# Patient Record
Sex: Female | Born: 2005 | Race: White | Hispanic: No | Marital: Single | Smoking: Current every day smoker
Health system: Southern US, Community
[De-identification: ages and names within clinical notes are randomized; demographics above are authoritative.]

## PROBLEM LIST (undated history)

## (undated) DIAGNOSIS — T50901A Poisoning by unspecified drugs, medicaments and biological substances, accidental (unintentional), initial encounter: Secondary | ICD-10-CM

## (undated) DIAGNOSIS — F419 Anxiety disorder, unspecified: Secondary | ICD-10-CM

## (undated) DIAGNOSIS — F319 Bipolar disorder, unspecified: Secondary | ICD-10-CM

---
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
## 2023-01-11 ENCOUNTER — Ambulatory Visit (HOSPITAL_COMMUNITY)
Admission: EM | Admit: 2023-01-11 | Discharge: 2023-01-11 | Disposition: A | Discharge status: Home / Self Care | Payer: Medicaid Other | Attending: Physician Assistant | Admitting: Physician Assistant

## 2023-01-11 ENCOUNTER — Other Ambulatory Visit: Payer: Self-pay

## 2023-01-11 ENCOUNTER — Encounter (HOSPITAL_COMMUNITY): Payer: Self-pay | Admitting: *Deleted

## 2023-01-11 DIAGNOSIS — J101 Influenza due to other identified influenza virus with other respiratory manifestations: Secondary | ICD-10-CM | POA: Diagnosis present

## 2023-01-11 DIAGNOSIS — Z1152 Encounter for screening for COVID-19: Secondary | ICD-10-CM | POA: Insufficient documentation

## 2023-01-11 LAB — POC INFLUENZA A AND B ANTIGEN (URGENT CARE ONLY)
INFLUENZA A ANTIGEN, POC: NEGATIVE
INFLUENZA B ANTIGEN, POC: POSITIVE — AB

## 2023-01-11 MED ORDER — ONDANSETRON 4 MG PO TBDP: 4.0000 mg | ORAL_TABLET | Freq: Three times a day (TID) | ORAL | 0 refills | Status: DC | PRN

## 2023-01-11 NOTE — ED Provider Notes (Signed)
[place]    CSN: [id number] Arrival date & time: 01/11/23  1135      History   Chief Complaint Chief Complaint  Patient presents with   Sore Throat   Cough   Emesis   Generalized Body Aches    HPI Brandi Martinez is a 17 y.o. female.   Patient here concerned with fever x 3 days.  Tmax 103.4 last night.  Admits fatigue, nasal congestion, rhinorrhea, cough, n/v.  She states her cousin has flu.  She is taking OTC cold/flu medication and dramamine for nausea.  She states dramamine is helping.    Past Medical History:  Diagnosis Date   Anxiety    Bipolar 1 disorder [facility])    Overdose     Patient Active Problem List   Diagnosis Date Noted   Bipolar disorder current episode depressed ([place]) 11/11/2022   Rhabdomyolysis 08/10/2022   Transaminitis 08/10/2022   Hypoglycemia 08/10/2022   Diphenhydramine overdose, intentional self-harm, initial encounter ([place]) 08/09/2022   Leukocytosis 08/09/2022    History reviewed. No pertinent surgical history.  OB History   No obstetric history on file.      Home Medications    Prior to Admission medications   Medication Sig Start Date End Date Taking? Authorizing Provider  ondansetron (ZOFRAN-ODT) 4 MG disintegrating tablet Take 1 tablet (4 mg total) by mouth every 8 (eight) hours as needed for nausea or vomiting. 01/11/23  Yes Peri Jefferson, PA-C  ARIPiprazole (ABILIFY) 5 MG tablet Take 1 tablet (5 mg total) by mouth daily. 11/14/22   Alesia Morin, MD  hydrOXYzine (VISTARIL) 25 MG capsule Take 25 mg by mouth every 6 (six) hours as needed for anxiety. 08/19/22   [provider]  nicotine polacrilex (NICORETTE) 2 MG gum Take 1 each (2 mg total) by mouth as needed for smoking cessation. 11/14/22   Alesia Morin, MD  SRONYX 0.1-20 MG-MCG tablet Take 1 tablet by mouth daily. 10/18/22   [provider]    Family History History reviewed. No pertinent family history.  Social History Social History    Tobacco Use   Smoking status: Every Day    Types: Cigarettes   Smokeless tobacco: Former  Scientific laboratory technician Use: Never used  Substance Use Topics   Alcohol use: Yes   Drug use: Yes    Types: Marijuana     Allergies   Patient has no known allergies.   Review of Systems Review of Systems  Constitutional:  Positive for appetite change, chills, fatigue and fever.  HENT:  Positive for congestion, postnasal drip and rhinorrhea. Negative for ear pain, nosebleeds, sinus pressure, sinus pain and sore throat.   Eyes:  Negative for pain and redness.  Respiratory:  Positive for cough. Negative for shortness of breath and wheezing.   Gastrointestinal:  Positive for nausea and vomiting. Negative for abdominal pain and diarrhea.  Musculoskeletal:  Positive for myalgias. Negative for arthralgias.  Skin:  Negative for rash.  Neurological:  Negative for light-headedness and headaches.  Hematological:  Negative for adenopathy. Does not bruise/bleed easily.  Psychiatric/Behavioral:  Positive for sleep disturbance. Negative for confusion.      Physical Exam Triage Vital Signs ED Triage Vitals  Enc Vitals Group     BP 01/11/23 1159 102/69     Pulse Rate 01/11/23 1159 (!) 115     Resp 01/11/23 1159 18     Temp 01/11/23 1159 99.8 F (37.7 C)     Temp src --  SpO2 01/11/23 1159 96 %     Weight --      Height --      Head Circumference --      Peak Flow --      Pain Score 01/11/23 1156 9     Pain Loc --      Pain Edu? --      Excl. in [place]? --    No data found.  Updated Vital Signs BP 102/69   Pulse (!) 115   Temp 99.8 F (37.7 C)   Resp 18   LMP 01/11/2023   SpO2 96%   Visual Acuity Right Eye Distance:   Left Eye Distance:   Bilateral Distance:    Right Eye Near:   Left Eye Near:    Bilateral Near:     Physical Exam Vitals and nursing note reviewed.  Constitutional:      General: She is not in acute distress.    Appearance: Normal appearance. She is not  ill-appearing.  HENT:     Head: Normocephalic and atraumatic.     Right Ear: Tympanic membrane and ear canal normal.     Left Ear: Tympanic membrane and ear canal normal.     Nose: Congestion and rhinorrhea present. No mucosal edema. Rhinorrhea is clear.     Comments: Injected nasal passages    Mouth/Throat:     Pharynx: Oropharynx is clear. Uvula midline. No pharyngeal swelling, oropharyngeal exudate or posterior oropharyngeal erythema.     Tonsils: No tonsillar exudate.  Eyes:     General: No scleral icterus.    Extraocular Movements: Extraocular movements intact.     Conjunctiva/sclera: Conjunctivae normal.  Cardiovascular:     Rate and Rhythm: Regular rhythm. Tachycardia present.     Heart sounds: No murmur heard. Pulmonary:     Effort: Pulmonary effort is normal. No respiratory distress.     Breath sounds: Normal breath sounds. No wheezing or rales.  Musculoskeletal:     Cervical back: Normal range of motion. No rigidity.  Lymphadenopathy:     Cervical: No cervical adenopathy.  Skin:    Coloration: Skin is not jaundiced.     Findings: No rash.  Neurological:     General: No focal deficit present.     Mental Status: She is alert and oriented to person, place, and time.     Motor: No weakness.     Gait: Gait normal.  Psychiatric:        Mood and Affect: Mood normal.        Behavior: Behavior normal.      UC Treatments / Results  Labs (all labs ordered are listed, but only abnormal results are displayed) Labs Reviewed  POC INFLUENZA A AND B ANTIGEN (URGENT CARE ONLY) - Abnormal; Notable for the following components:      Result Value   INFLUENZA B ANTIGEN, POC POSITIVE (*)    All other components within normal limits  SARS CORONAVIRUS 2 (TAT 6-24 HRS)    EKG   Radiology No results found.  Procedures Procedures (including critical care time)  Medications Ordered in UC Medications - No data to display  Initial Impression / Assessment and Plan / UC Course   I have reviewed the triage vital signs and the nursing notes.  Pertinent labs & imaging results that were available during my care of the patient were reviewed by me and considered in my medical decision making (see chart for details).     Outside treatment window for  Tamiflu Drink plenty of water Get plenty of rest  Final Clinical Impressions(s) / UC Diagnoses   Final diagnoses:  Influenza B     Discharge Instructions      Take ibuprofen as needed     ED Prescriptions     Medication Sig Dispense Auth. Provider   ondansetron (ZOFRAN-ODT) 4 MG disintegrating tablet Take 1 tablet (4 mg total) by mouth every 8 (eight) hours as needed for nausea or vomiting. 8 tablet Peri Jefferson, PA-C      PDMP not reviewed this encounter.   Peri Jefferson, PA-C 01/11/23 1310

## 2023-01-11 NOTE — ED Triage Notes (Signed)
Pt reports since Thursday she has had Vomiting ,sore throat ,cough and general body aches.

## 2023-01-11 NOTE — Discharge Instructions (Signed)
Take ibuprofen as needed. 

## 2023-01-12 LAB — SARS CORONAVIRUS 2 (TAT 6-24 HRS): SARS Coronavirus 2: NEGATIVE

## 2024-04-03 ENCOUNTER — Ambulatory Visit (HOSPITAL_COMMUNITY)
Admission: EM | Admit: 2024-04-03 | Discharge: 2024-04-04 | Disposition: A | Discharge status: Other Acute Inpatient Hospital | Payer: MEDICAID | Attending: Urology | Admitting: Urology

## 2024-04-03 DIAGNOSIS — F319 Bipolar disorder, unspecified: Secondary | ICD-10-CM | POA: Insufficient documentation

## 2024-04-03 DIAGNOSIS — F322 Major depressive disorder, single episode, severe without psychotic features: Secondary | ICD-10-CM | POA: Insufficient documentation

## 2024-04-03 DIAGNOSIS — Z9151 Personal history of suicidal behavior: Secondary | ICD-10-CM | POA: Insufficient documentation
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
## 2024-04-04 ENCOUNTER — Encounter (HOSPITAL_COMMUNITY): Payer: Self-pay | Admitting: Psychiatry

## 2024-04-04 ENCOUNTER — Inpatient Hospital Stay (HOSPITAL_COMMUNITY)
Admission: AD | Admit: 2024-04-04 | Discharge: 2024-04-07 | DRG: 885 | Disposition: A | Discharge status: Home / Self Care | Payer: MEDICAID | Source: Intra-hospital | Attending: Psychiatry | Admitting: Psychiatry

## 2024-04-04 ENCOUNTER — Other Ambulatory Visit: Payer: Self-pay

## 2024-04-04 DIAGNOSIS — F1290 Cannabis use, unspecified, uncomplicated: Secondary | ICD-10-CM | POA: Diagnosis present

## 2024-04-04 DIAGNOSIS — R45851 Suicidal ideations: Secondary | ICD-10-CM | POA: Diagnosis present

## 2024-04-04 DIAGNOSIS — F17290 Nicotine dependence, other tobacco product, uncomplicated: Secondary | ICD-10-CM | POA: Diagnosis present

## 2024-04-04 DIAGNOSIS — Z560 Unemployment, unspecified: Secondary | ICD-10-CM

## 2024-04-04 DIAGNOSIS — F319 Bipolar disorder, unspecified: Secondary | ICD-10-CM | POA: Diagnosis not present

## 2024-04-04 DIAGNOSIS — F17210 Nicotine dependence, cigarettes, uncomplicated: Secondary | ICD-10-CM | POA: Diagnosis present

## 2024-04-04 DIAGNOSIS — Z91148 Patient's other noncompliance with medication regimen for other reason: Secondary | ICD-10-CM

## 2024-04-04 DIAGNOSIS — Z630 Problems in relationship with spouse or partner: Secondary | ICD-10-CM | POA: Diagnosis not present

## 2024-04-04 DIAGNOSIS — F322 Major depressive disorder, single episode, severe without psychotic features: Secondary | ICD-10-CM | POA: Insufficient documentation

## 2024-04-04 DIAGNOSIS — F3130 Bipolar disorder, current episode depressed, mild or moderate severity, unspecified: Principal | ICD-10-CM | POA: Diagnosis present

## 2024-04-04 DIAGNOSIS — F314 Bipolar disorder, current episode depressed, severe, without psychotic features: Secondary | ICD-10-CM | POA: Diagnosis not present

## 2024-04-04 DIAGNOSIS — Z91199 Patient's noncompliance with other medical treatment and regimen due to unspecified reason: Secondary | ICD-10-CM | POA: Diagnosis not present

## 2024-04-04 DIAGNOSIS — Z9151 Personal history of suicidal behavior: Secondary | ICD-10-CM

## 2024-04-04 LAB — HEMOGLOBIN A1C
Hgb A1c MFr Bld: 4.9 % (ref 4.8–5.6)
Mean Plasma Glucose: 93.93 mg/dL

## 2024-04-04 LAB — CBC WITH DIFFERENTIAL/PLATELET
Abs Immature Granulocytes: 0.07 10*3/uL (ref 0.00–0.07)
Abs Immature Granulocytes: 0.03 10*3/uL (ref 0.00–0.07)
Basophils Absolute: 0.1 10*3/uL (ref 0.0–0.1)
Basophils Absolute: 0 10*3/uL (ref 0.0–0.1)
Basophils Relative: 0 %
Basophils Relative: 0 %
Eosinophils Absolute: 0 10*3/uL (ref 0.0–0.5)
Eosinophils Absolute: 0.1 10*3/uL (ref 0.0–0.5)
Eosinophils Relative: 0 %
Eosinophils Relative: 0 %
HCT: 42 % (ref 36.0–46.0)
HCT: 40.4 % (ref 36.0–46.0)
Hemoglobin: 13.7 g/dL (ref 12.0–15.0)
Hemoglobin: 13.3 g/dL (ref 12.0–15.0)
Immature Granulocytes: 0 %
Immature Granulocytes: 0 %
Lymphocytes Relative: 11 %
Lymphocytes Relative: 22 %
Lymphs Abs: 2.3 10*3/uL (ref 0.7–4.0)
Lymphs Abs: 2.6 10*3/uL (ref 0.7–4.0)
MCH: 29.2 pg (ref 26.0–34.0)
MCH: 29 pg (ref 26.0–34.0)
MCHC: 32.6 g/dL (ref 30.0–36.0)
MCHC: 32.9 g/dL (ref 30.0–36.0)
MCV: 89.6 fL (ref 80.0–100.0)
MCV: 88 fL (ref 80.0–100.0)
Monocytes Absolute: 0.9 10*3/uL (ref 0.1–1.0)
Monocytes Absolute: 0.7 10*3/uL (ref 0.1–1.0)
Monocytes Relative: 5 %
Monocytes Relative: 6 %
Neutro Abs: 16.4 10*3/uL — ABNORMAL HIGH (ref 1.7–7.7)
Neutro Abs: 8.1 10*3/uL — ABNORMAL HIGH (ref 1.7–7.7)
Neutrophils Relative %: 84 %
Neutrophils Relative %: 72 %
Platelets: 228 10*3/uL (ref 150–400)
Platelets: 218 10*3/uL (ref 150–400)
RBC: 4.69 MIL/uL (ref 3.87–5.11)
RBC: 4.59 MIL/uL (ref 3.87–5.11)
RDW: 12.9 % (ref 11.5–15.5)
RDW: 13 % (ref 11.5–15.5)
WBC: 19.7 10*3/uL — ABNORMAL HIGH (ref 4.0–10.5)
WBC: 11.5 10*3/uL — ABNORMAL HIGH (ref 4.0–10.5)
nRBC: 0 % (ref 0.0–0.2)
nRBC: 0 % (ref 0.0–0.2)

## 2024-04-04 LAB — COMPREHENSIVE METABOLIC PANEL WITH GFR
ALT: 13 U/L (ref 0–44)
AST: 20 U/L (ref 15–41)
Albumin: 5.1 g/dL — ABNORMAL HIGH (ref 3.5–5.0)
Alkaline Phosphatase: 69 U/L (ref 38–126)
Anion gap: 14 (ref 5–15)
BUN: 13 mg/dL (ref 6–20)
CO2: 22 mmol/L (ref 22–32)
Calcium: 10 mg/dL (ref 8.9–10.3)
Chloride: 103 mmol/L (ref 98–111)
Creatinine, Ser: 0.84 mg/dL (ref 0.44–1.00)
GFR, Estimated: >60 mL/min (ref >60)
Glucose, Bld: 86 mg/dL (ref 70–99)
Potassium: 3.7 mmol/L (ref 3.5–5.1)
Sodium: 139 mmol/L (ref 135–145)
Total Bilirubin: 0.7 mg/dL (ref 0.0–1.2)
Total Protein: 8.2 g/dL — ABNORMAL HIGH (ref 6.5–8.1)

## 2024-04-04 LAB — POCT URINE DRUG SCREEN - MANUAL ENTRY (I-SCREEN)
POC Amphetamine UR: NOT DETECTED
POC Buprenorphine (BUP): NOT DETECTED
POC Cocaine UR: NOT DETECTED
POC Marijuana UR: POSITIVE — AB
POC Methadone UR: NOT DETECTED
POC Methamphetamine UR: NOT DETECTED
POC Morphine: NOT DETECTED
POC Oxazepam (BZO): NOT DETECTED
POC Oxycodone UR: NOT DETECTED
POC Secobarbital (BAR): NOT DETECTED

## 2024-04-04 LAB — LIPID PANEL
Cholesterol: 179 mg/dL (ref 0–200)
HDL: 60 mg/dL (ref >40)
LDL Cholesterol: 110 mg/dL — ABNORMAL HIGH (ref 0–99)
Total CHOL/HDL Ratio: 3 ratio
Triglycerides: 46 mg/dL (ref <150)
VLDL: 9 mg/dL (ref 0–40)

## 2024-04-04 LAB — POC URINE PREG, ED: Preg Test, Ur: NEGATIVE

## 2024-04-04 LAB — ETHANOL: Alcohol, Ethyl (B): <15 mg/dL (ref <15)

## 2024-04-04 LAB — TSH: TSH: 2.729 u[IU]/mL (ref 0.350–4.500)

## 2024-04-04 MED ORDER — HALOPERIDOL LACTATE 5 MG/ML IJ SOLN: 5.0000 mg | Freq: Three times a day (TID) | INTRAMUSCULAR | Status: DC | PRN

## 2024-04-04 MED ORDER — DIPHENHYDRAMINE HCL 50 MG/ML IJ SOLN: 50.0000 mg | Freq: Three times a day (TID) | INTRAMUSCULAR | Status: DC | PRN

## 2024-04-04 MED ORDER — ACETAMINOPHEN 325 MG PO TABS: 650.0000 mg | ORAL_TABLET | Freq: Four times a day (QID) | ORAL | Status: DC | PRN

## 2024-04-04 MED ORDER — MAGNESIUM HYDROXIDE 400 MG/5ML PO SUSP: 30.0000 mL | Freq: Every day | ORAL | Status: DC | PRN

## 2024-04-04 MED ORDER — ALUM & MAG HYDROXIDE-SIMETH 200-200-20 MG/5ML PO SUSP: 30.0000 mL | ORAL | Status: DC | PRN

## 2024-04-04 MED ORDER — HYDROXYZINE HCL 25 MG PO TABS
25.0000 mg | ORAL_TABLET | Freq: Three times a day (TID) | ORAL | Status: DC | PRN
  Administered 2024-04-04: 25 mg via ORAL
  Filled 2024-04-04: qty 1

## 2024-04-04 MED ORDER — DIPHENHYDRAMINE HCL 25 MG PO CAPS: 50.0000 mg | ORAL_CAPSULE | Freq: Three times a day (TID) | ORAL | Status: DC | PRN

## 2024-04-04 MED ORDER — HALOPERIDOL 5 MG PO TABS: 5.0000 mg | ORAL_TABLET | Freq: Three times a day (TID) | ORAL | Status: DC | PRN

## 2024-04-04 MED ORDER — LORAZEPAM 2 MG/ML IJ SOLN: 2.0000 mg | Freq: Three times a day (TID) | INTRAMUSCULAR | Status: DC | PRN

## 2024-04-04 MED ORDER — HALOPERIDOL LACTATE 5 MG/ML IJ SOLN: 10.0000 mg | Freq: Three times a day (TID) | INTRAMUSCULAR | Status: DC | PRN

## 2024-04-04 MED ORDER — HYDROXYZINE HCL 25 MG PO TABS
25.0000 mg | ORAL_TABLET | Freq: Three times a day (TID) | ORAL | Status: DC | PRN
  Administered 2024-04-04 – 2024-04-06 (×3): 25 mg via ORAL
  Filled 2024-04-04 (×3): qty 1

## 2024-04-04 MED ORDER — TRAZODONE HCL 50 MG PO TABS
50.0000 mg | ORAL_TABLET | Freq: Every evening | ORAL | Status: DC | PRN
  Administered 2024-04-04: 50 mg via ORAL
  Filled 2024-04-04: qty 1

## 2024-04-04 MED ORDER — DIPHENHYDRAMINE HCL 50 MG PO CAPS: 50.0000 mg | ORAL_CAPSULE | Freq: Three times a day (TID) | ORAL | Status: DC | PRN

## 2024-04-04 MED ORDER — TRAZODONE HCL 50 MG PO TABS
50.0000 mg | ORAL_TABLET | Freq: Every evening | ORAL | Status: DC | PRN
  Administered 2024-04-04 – 2024-04-06 (×3): 50 mg via ORAL
  Filled 2024-04-04 (×3): qty 1

## 2024-04-04 NOTE — Progress Notes (Signed)
 Patient admitted to unit, reviewed unit schedule and rules,patient verbalized understanding. Assessment completed per flowsheet, 15 mins safety checks in place, patient taken to the gym by staff, denies needs at this time.
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
## 2024-04-04 NOTE — Progress Notes (Signed)
   04/04/24 1500  Psych Admission Type (Psych Patients Only)  Admission Status Voluntary  Psychosocial Assessment  Patient Complaints Anxiety;Irritability  Eye Contact Fair  Facial Expression Animated  Affect Anxious  Speech Logical/coherent  Interaction Assertive  Motor Activity Other (Comment) (WNL)  Appearance/Hygiene Unremarkable  Behavior Characteristics Cooperative;Appropriate to situation  Mood Anxious  Thought Process  Coherency Circumstantial  Content Blaming others  Delusions None reported or observed  Perception WDL  Hallucination None reported or observed  Judgment Poor  Confusion None  Danger to Self  Current suicidal ideation? Denies  Agreement Not to Harm Self Yes  Description of Agreement verbal  Danger to Others  Danger to Others None reported or observed

## 2024-04-04 NOTE — [facility] Group Notes (Signed)
 [facility] Group Notes:  (Nursing/MHT/Case Management/Adjunct)  Date:  04/04/2024  Time:  10:39 PM  Type of Therapy:  Psychoeducational Skills  Participation Level:  Did Not Attend  Participation Quality:  Resistant  Affect:  Resistant  Cognitive:  Lacking  Insight:  None  Engagement in Group:  None  Modes of Intervention:  Education  Summary of Progress/Problems: The patient did not attend group this evening.   Deserie Dirks S 04/04/2024, 10:39 PM
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
## 2024-04-04 NOTE — Discharge Instructions (Addendum)
 Transfer patient to [facility] for inpatient admission with Dr. Zouev
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
## 2024-04-04 NOTE — ED Notes (Addendum)
 Pt being transported to accepting facility [facility]) now via [facility] transport. All belongings given to transport service. Pt remains stable, calm and cooperative. No s/sx of distress. Any questions regarding transfer answered. All paperwork present in folder, also being given to transport service. No further concerns.

## 2024-04-04 NOTE — Plan of Care (Signed)
   Problem: Education: Goal: Knowledge of Brandi Martinez General Education information/materials will improve Outcome: Progressing Goal: Emotional status will improve Outcome: Progressing Goal: Mental status will improve Outcome: Progressing Goal: Verbalization of understanding the information provided will improve Outcome: Progressing   Problem: Activity: Goal: Interest or engagement in activities will improve Outcome: Progressing Goal: Sleeping patterns will improve Outcome: Progressing   Problem: Coping: Goal: Ability to verbalize frustrations and anger appropriately will improve Outcome: Progressing Goal: Ability to demonstrate self-control will improve Outcome: Progressing

## 2024-04-04 NOTE — ED Notes (Signed)
 Pt has been off/on the phone with her mother- to which was upsetting her. NP Orelia Binet decided to kepe pt, tried explaining voluntary vs IVC. Pt became very upset, yelling and crying. Rn went in and sat with pt, explained entire process & why she is recommending admission. After a while of talking, the pt is much more calm and is agreeable to sign the voluntary form and go to [facility] for inpatient treatment. RN provided pt with the address & phone number of [facility], d/t pt request- pt is informing mother to bring her clothes there. No further concerns at this time. Lunch given, voluntary consent completed.
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
## 2024-04-04 NOTE — Progress Notes (Signed)
 Pt has been accepted to [facility] on 04/04/2024 Bed assignment: 400-2  Pt meets inpatient criteria per: Fain Home NP  Attending Physician will be: Dr. Zouev MD  Report can be called to: Adult unit: [phone]  Pt can arrive after CBC is rechecked   Care Team Notified: [facility]  Bevin Bucks RN, Sarahann Cumins NP, Jacquelyn Blue RN  [place] Jakyrie Totherow LCSW-A   04/04/2024 10:12 AM

## 2024-04-04 NOTE — ED Notes (Signed)
 Pt observed/assessed in recliner sleeping. RR even and unlabored, appearing in no noted distress. Environmental check complete, will continue to monitor for safety

## 2024-04-04 NOTE — ED Notes (Signed)
 Report called to Fredrik Jensen, Charity fundraiser. SAFE transport called & arranged.

## 2024-04-04 NOTE — [facility] Group Notes (Signed)
Pt did attend AA 

## 2024-04-05 DIAGNOSIS — F314 Bipolar disorder, current episode depressed, severe, without psychotic features: Secondary | ICD-10-CM | POA: Diagnosis not present

## 2024-04-05 MED ORDER — ARIPIPRAZOLE 5 MG PO TABS
5.0000 mg | ORAL_TABLET | Freq: Every day | ORAL | Status: DC
  Administered 2024-04-05 – 2024-04-07 (×3): 5 mg via ORAL
  Filled 2024-04-05 (×4): qty 1

## 2024-04-05 MED ORDER — NICOTINE POLACRILEX 2 MG MT GUM
2.0000 mg | CHEWING_GUM | OROMUCOSAL | Status: DC | PRN
  Administered 2024-04-05 – 2024-04-07 (×3): 2 mg via ORAL
  Filled 2024-04-05: qty 1

## 2024-04-05 NOTE — Plan of Care (Signed)
  Problem: Activity: Goal: Interest or engagement in activities will improve Outcome: Progressing   Problem: Coping: Goal: Ability to verbalize frustrations and anger appropriately will improve Outcome: Progressing   Problem: Physical Regulation: Goal: Ability to maintain clinical measurements within normal limits will improve Outcome: Progressing   

## 2024-04-05 NOTE — Plan of Care (Signed)
   Problem: Education: Goal: Emotional status will improve Outcome: Progressing Goal: Mental status will improve Outcome: Progressing   Problem: Activity: Goal: Interest or engagement in activities will improve Outcome: Progressing Goal: Sleeping patterns will improve Outcome: Progressing

## 2024-04-05 NOTE — [facility] Suicide Risk Assessment (Signed)
 [facility] Admission Suicide Risk Assessment   Nursing information obtained from:  Patient Demographic factors:  Adolescent or young adult Current Mental Status:  Suicidal ideation indicated by patient Loss Factors:  NA Historical Factors:  NA Risk Reduction Factors:  Positive therapeutic relationship  Total Time spent with patient: 1 hour Principal Problem: Bipolar disorder current episode depressed (HCC) Diagnosis:  Principal Problem:   Bipolar disorder current episode depressed (HCC)  Subjective Data: See H&P  Continued Clinical Symptoms:  Alcohol Use Disorder Identification Test Final Score (AUDIT): 1 The "Alcohol Use Disorders Identification Test", Guidelines for Use in Primary Care, Second Edition.  World Science writer [facility]). Score between 0-7:  no or low risk or alcohol related problems. Score between 8-15:  moderate risk of alcohol related problems. Score between 16-19:  high risk of alcohol related problems. Score 20 or above:  warrants further diagnostic evaluation for alcohol dependence and treatment.   CLINICAL FACTORS:   Bipolar Disorder:   Depressive phase   Musculoskeletal: Strength & Muscle Tone: within normal limits Gait & Station: normal Patient leans: N/A  Psychiatric Specialty Exam:  Presentation  General Appearance:  Appropriate for Environment  Eye Contact: Good  Speech: Clear and Coherent  Speech Volume: Normal  Handedness: Right   Mood and Affect  Mood: Depressed; Anxious  Affect: Congruent   Thought Process  Thought Processes: Coherent  Descriptions of Associations:Intact  Orientation:Full (Time, Place and Person)  Thought Content:WDL  History of Schizophrenia/Schizoaffective disorder:No data recorded Duration of Psychotic Symptoms:No data recorded Hallucinations:Hallucinations: None  Ideas of Reference:None  Suicidal Thoughts:Suicidal Thoughts: Yes, Passive SI Passive Intent and/or Plan: Without Plan  Homicidal  Thoughts:Homicidal Thoughts: No   Sensorium  Memory: Immediate Good; Recent Good; Remote Good  Judgment: Poor  Insight: Poor   Executive Functions  Concentration: Good  Attention Span: Good  Recall: Good  Fund of Knowledge: Good  Language: Good   Psychomotor Activity  Psychomotor Activity: Psychomotor Activity: Normal   Assets  Assets: Communication Skills; Desire for Improvement; Housing; Physical Health   Sleep  Sleep: Sleep: Good Number of Hours of Sleep: 7    Physical Exam: Physical Exam ROS Blood pressure 111/77, pulse 75, temperature 97.9 F (36.6 C), temperature source Oral, resp. rate 12, height 5' (1.524 m), weight 42.6 kg, SpO2 99%. Body mass index is 18.36 kg/m.   COGNITIVE FEATURES THAT CONTRIBUTE TO RISK:  None    SUICIDE RISK:   Moderate:  Frequent suicidal ideation with limited intensity, and duration, some specificity in terms of plans, no associated intent, good self-control, limited dysphoria/symptomatology, some risk factors present, and identifiable protective factors, including available and accessible social support.  PLAN OF CARE: See H&P  I certify that inpatient services furnished can reasonably be expected to improve the patient's condition.   Brandi Martinez Linnie Riches, MD 04/05/2024, 10:33 AM

## 2024-04-05 NOTE — Group Note (Signed)
 Date:  04/05/2024 Time:  9:02 AM  Group Topic/Focus:  Goals Group:   The focus of this group is to help patients establish daily goals to achieve during treatment and discuss how the patient can incorporate goal setting into their daily lives to aide in recovery.    Participation Level:  Active  Participation Quality:  Appropriate  Affect:  Appropriate   Brandi Martinez 04/05/2024, 9:02 AM

## 2024-04-05 NOTE — Progress Notes (Signed)
   04/05/24 0800  Psych Admission Type (Psych Patients Only)  Admission Status Voluntary  Psychosocial Assessment  Patient Complaints None  Eye Contact Fair  Facial Expression Animated  Affect Anxious  Speech Logical/coherent  Interaction Assertive  Motor Activity Fidgety  Appearance/Hygiene Unremarkable  Behavior Characteristics Cooperative;Appropriate to situation  Mood Pleasant  Thought Process  Coherency Circumstantial  Content Blaming others  Delusions None reported or observed  Perception WDL  Hallucination None reported or observed  Judgment Poor  Confusion None  Danger to Self  Current suicidal ideation? Denies  Agreement Not to Harm Self Yes  Description of Agreement Verbal  Danger to Others  Danger to Others None reported or observed
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
## 2024-04-05 NOTE — Progress Notes (Signed)
   04/05/24 2030  Psych Admission Type (Psych Patients Only)  Admission Status Voluntary  Psychosocial Assessment  Patient Complaints None  Eye Contact Fair  Facial Expression Animated  Affect Anxious  Speech Logical/coherent  Interaction Assertive  Motor Activity Fidgety  Appearance/Hygiene Unremarkable  Behavior Characteristics Cooperative  Mood Pleasant  Aggressive Behavior  Effect No apparent injury  Thought Process  Coherency Circumstantial  Content Blaming others  Delusions WDL  Perception WDL  Hallucination None reported or observed  Judgment Poor  Confusion WDL  Danger to Self  Current suicidal ideation? Denies  Danger to Others  Danger to Others None reported or observed

## 2024-04-05 NOTE — Plan of Care (Signed)
   Problem: Education: Goal: Knowledge of [facility] Education information/materials will improve Outcome: Progressing Goal: Emotional status will improve Outcome: Progressing Goal: Mental status will improve Outcome: Progressing

## 2024-04-05 NOTE — Group Note (Signed)
 Date:  04/05/2024 Time:  8:56 PM  Group Topic/Focus:  Wrap-Up Group:   The focus of this group is to help patients review their daily goal of treatment and discuss progress on daily workbooks.    Participation Level:  Active  Participation Quality:  Appropriate and Attentive  Affect:  Appropriate  Cognitive:  Alert and Appropriate  Insight: Appropriate and Good  Engagement in Group:  Engaged  Modes of Intervention:  Discussion and Education  Additional Comments:  Pt attended and participated in wrap up group this evening and rated their day an 8/10. Pt stated that they were unpleasantly surprised by their best friend visiting instead of their mother. Pt states that they spoke with their mother concerning this matter, and received confirmation from their mother that this wont happen again. Pt completed their goal, which was to stay motivated and focused.   Eligah Grow 04/05/2024, 8:56 PM

## 2024-04-05 NOTE — Group Note (Signed)
 LCSW Group Therapy Note   Group Date: 04/05/2024 Start Time: 1100 End Time: 1200  Participation:  patient was present and participated in the conversation.  Type of Therapy:  Group Therapy  Topic:  "Finding Balance: Using Wise Mind for Thoughtful Decisions"  Objective:  the objective of this class is to help participants understand the concept of Wise Mind and learn how to apply it to real-life situations to make balanced, thoughtful decisions. Participants will gain tools to manage emotions, consider logic, and find a middle ground that leads to healthier responses and outcomes.  Goals: Understand the concept of Wise Mind.  Participants will learn the difference between Emotional Mind, Reasonable Mind, and Agustina Aldrich Mind, and how Agustina Aldrich Mind helps in balancing emotions and logic to make thoughtful decisions. Recognize when you're in Emotional Mind or Reasonable Mind.  Participants will identify the signs of Emotional Mind and Reasonable Mind in their own reactions to situations and understand how to move into Wise Mind for more balanced responses. Practice applying Agustina Aldrich Mind to real-life situations.  Through scenarios and group activities, participants will practice using Wise Mind in everyday situations, learning how to acknowledge their emotions, think logically, and create solutions that are thoughtful and balanced.  Summary: In this class, we explored the concept of Wise Mind--the balance between Emotional Mind and Reasonable Mind. We discussed how Emotional Mind can sometimes lead to impulsive, reactive decisions driven by intense feelings, and how Reasonable Mind might ignore feelings altogether, focusing only on facts and logic. Agustina Aldrich Mind is the middle ground that combines both, allowing you to consider your emotions and use logic to make balanced, thoughtful decisions.  We learned how to recognize when we're in Emotional Mind or Reasonable Mind, and practiced using Wise Mind in real-life  situations. By using Beola [place], we can improve how we handle challenging situations, make better decisions, and strengthen our relationships with others.  Therapeutic Modalities: Elements of DBT - emotional regulation   Kitty Cadavid O Jeryn Cerney, LCSWA 04/05/2024  1:19 PM
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
## 2024-04-06 ENCOUNTER — Encounter (HOSPITAL_COMMUNITY): Payer: Self-pay

## 2024-04-06 DIAGNOSIS — F314 Bipolar disorder, current episode depressed, severe, without psychotic features: Secondary | ICD-10-CM | POA: Diagnosis not present

## 2024-04-06 LAB — HEMOGLOBIN A1C
Hgb A1c MFr Bld: 4.8 % (ref 4.8–5.6)
Mean Plasma Glucose: 91.06 mg/dL

## 2024-04-06 LAB — LIPID PANEL
Cholesterol: 167 mg/dL (ref 0–200)
HDL: 55 mg/dL (ref >40)
LDL Cholesterol: 102 mg/dL — ABNORMAL HIGH (ref 0–99)
Total CHOL/HDL Ratio: 3 ratio
Triglycerides: 52 mg/dL (ref <150)
VLDL: 10 mg/dL (ref 0–40)

## 2024-04-06 NOTE — BH IP Treatment Plan (Signed)
 Interdisciplinary Treatment and Diagnostic Plan Update  04/06/2024 Time of Session: 1040am Brandi Martinez MRN: [id number]  Principal Diagnosis: Bipolar disorder current episode depressed (HCC)  Secondary Diagnoses: Principal Problem:   Bipolar disorder current episode depressed (HCC)   Current Medications:  Current Facility-Administered Medications  Medication Dose Route Frequency Provider Last Rate Last Admin   acetaminophen  (TYLENOL ) tablet 650 mg  650 mg Oral Q6H PRN Coleman, Carolyn H, NP       alum & mag hydroxide-simeth (MAALOX/MYLANTA) 200-200-20 MG/5ML suspension 30 mL  30 mL Oral Q4H PRN Coleman, Carolyn H, NP       ARIPiprazole  (ABILIFY ) tablet 5 mg  5 mg Oral Daily Linnie Riches, Nadir, MD   5 mg at 04/06/24 6213   haloperidol  (HALDOL ) tablet 5 mg  5 mg Oral TID PRN Costella Dirks, NP       And   diphenhydrAMINE (BENADRYL) capsule 50 mg  50 mg Oral TID PRN Costella Dirks, NP       haloperidol  lactate (HALDOL ) injection 5 mg  5 mg Intramuscular TID PRN Coleman, Carolyn H, NP       And   diphenhydrAMINE (BENADRYL) injection 50 mg  50 mg Intramuscular TID PRN Costella Dirks, NP       And   LORazepam  (ATIVAN ) injection 2 mg  2 mg Intramuscular TID PRN Costella Dirks, NP       haloperidol  lactate (HALDOL ) injection 10 mg  10 mg Intramuscular TID PRN Coleman, Carolyn H, NP       And   diphenhydrAMINE (BENADRYL) injection 50 mg  50 mg Intramuscular TID PRN Costella Dirks, NP       And   LORazepam  (ATIVAN ) injection 2 mg  2 mg Intramuscular TID PRN Coleman, Carolyn H, NP       hydrOXYzine  (ATARAX ) tablet 25 mg  25 mg Oral TID PRN Coleman, Carolyn H, NP   25 mg at 04/05/24 2104   magnesium  hydroxide (MILK OF MAGNESIA) suspension 30 mL  30 mL Oral Daily PRN Coleman, Carolyn H, NP       nicotine  polacrilex (NICORETTE ) gum 2 mg  2 mg Oral PRN Linnie Riches, Nadir, MD   2 mg at 04/05/24 1342   traZODone  (DESYREL ) tablet 50 mg  50 mg Oral QHS PRN Coleman, Carolyn H, NP   50 mg  at 04/05/24 2104   PTA Medications: Medications Prior to Admission  Medication Sig Dispense Refill Last Dose/Taking   norethindrone (MICRONOR) 0.35 MG tablet Take 1 tablet by mouth daily.       Patient Stressors:    Patient Strengths:    Treatment Modalities: Medication Management, Group therapy, Case management,  1 to 1 session with clinician, Psychoeducation, Recreational therapy.   Physician Treatment Plan for Primary Diagnosis: Bipolar disorder current episode depressed (HCC) Long Term Goal(s): Improvement in symptoms so as ready for discharge   Short Term Goals: Ability to identify changes in lifestyle to reduce recurrence of condition will improve Ability to verbalize feelings will improve Ability to disclose and discuss suicidal ideas Ability to demonstrate self-control will improve Ability to identify and develop effective coping behaviors will improve  Medication Management: Evaluate patient's response, side effects, and tolerance of medication regimen.  Therapeutic Interventions: 1 to 1 sessions, Unit Group sessions and Medication administration.  Evaluation of Outcomes: Not Progressing  Physician Treatment Plan for Secondary Diagnosis: Principal Problem:   Bipolar disorder current episode depressed (HCC)  Long Term Goal(s): Improvement in symptoms so as ready  for discharge   Short Term Goals: Ability to identify changes in lifestyle to reduce recurrence of condition will improve Ability to verbalize feelings will improve Ability to disclose and discuss suicidal ideas Ability to demonstrate self-control will improve Ability to identify and develop effective coping behaviors will improve     Medication Management: Evaluate patient's response, side effects, and tolerance of medication regimen.  Therapeutic Interventions: 1 to 1 sessions, Unit Group sessions and Medication administration.  Evaluation of Outcomes: Not Progressing   RN Treatment Plan for Primary  Diagnosis: Bipolar disorder current episode depressed (HCC) Long Term Goal(s): Knowledge of disease and therapeutic regimen to maintain health will improve  Short Term Goals: Ability to remain free from injury will improve, Ability to verbalize frustration and anger appropriately will improve, Ability to demonstrate self-control, Ability to participate in decision making will improve, Ability to verbalize feelings will improve, Ability to disclose and discuss suicidal ideas, Ability to identify and develop effective coping behaviors will improve, and Compliance with prescribed medications will improve  Medication Management: RN will administer medications as ordered by provider, will assess and evaluate patient's response and provide education to patient for prescribed medication. RN will report any adverse and/or side effects to prescribing provider.  Therapeutic Interventions: 1 on 1 counseling sessions, Psychoeducation, Medication administration, Evaluate responses to treatment, Monitor vital signs and CBGs as ordered, Perform/monitor CIWA, COWS, AIMS and Fall Risk screenings as ordered, Perform wound care treatments as ordered.  Evaluation of Outcomes: Not Progressing   LCSW Treatment Plan for Primary Diagnosis: Bipolar disorder current episode depressed (HCC) Long Term Goal(s): Safe transition to appropriate next level of care at discharge, Engage patient in therapeutic group addressing interpersonal concerns.  Short Term Goals: Engage patient in aftercare planning with referrals and resources, Increase social support, Increase ability to appropriately verbalize feelings, Increase emotional regulation, Facilitate acceptance of mental health diagnosis and concerns, Facilitate patient progression through stages of change regarding substance use diagnoses and concerns, Identify triggers associated with mental health/substance abuse issues, and Increase skills for wellness and recovery  Therapeutic  Interventions: Assess for all discharge needs, 1 to 1 time with Social worker, Explore available resources and support systems, Assess for adequacy in community support network, Educate family and significant other(s) on suicide prevention, Complete Psychosocial Assessment, Interpersonal group therapy.  Evaluation of Outcomes: Not Progressing   Progress in Treatment: Attending groups: Yes. Participating in groups: Yes. Taking medication as prescribed: Yes. Toleration medication: Yes. Family/Significant other contact made: Yes, individual(s) contacted:  Mother, [name] [phone] Patient understands diagnosis: Yes. Discussing patient identified problems/goals with staff: Yes. Medical problems stabilized or resolved: Yes. Denies suicidal/homicidal ideation: Yes. Issues/concerns per patient self-inventory: No.   New problem(s) identified: No, Describe:  none  New Short Term/Long Term Goal(s): medication stabilization, elimination of SI thoughts, development of comprehensive mental wellness plan.    Patient Goals:  "Plan out how to cope with those around me that I know after I discharge. I also need to get a job"  Discharge Plan or Barriers: Patient recently admitted. CSW will continue to follow and assess for appropriate referrals and possible discharge planning.     Reason for Continuation of Hospitalization: Depression Medication stabilization Suicidal ideation  Estimated Length of Stay: 5-7 days  Last 3 [place] Suicide Severity Risk Score: Flowsheet Row Ad[facility] (Current) [facility] from 04/03/2024 in [facility] ED from 01/11/2023 in [facility] RISK  CATEGORY No Risk Error: Question 1 not populated No Risk       Last PHQ 2/9 Scores:    04/24/2018   10:21 AM 10/09/2017   10:16 AM  Depression screen PHQ 2/9  Decreased Interest 0 0  Down,  Depressed, Hopeless 0 0  PHQ - 2 Score 0 0    Scribe for Treatment Team: Vonzell Guerin, LCSWA 04/06/2024 2:22 PM
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
## 2024-04-06 NOTE — Group Note (Signed)
 Date:  04/06/2024 Time:  2:50 PM  Group Topic/Focus:  Recovery Goals:   The focus of this group is to promote emotional wellness by identifying unhealthy thought patterns and how to challenge them.    Participation Level:  Minimal  Participation Quality:  Appropriate  Affect:  Appropriate  Cognitive:  Appropriate  Insight: Appropriate  Engagement in Group:  Developing/Improving  Modes of Intervention:  Discussion and Exploration  Additional Comments:    Brandi Martinez D Chayil Gantt 04/06/2024, 2:50 PM

## 2024-04-06 NOTE — Plan of Care (Signed)
   Problem: Education: Goal: Knowledge of [facility] Education information/materials will improve Outcome: Progressing Goal: Emotional status will improve Outcome: Progressing Goal: Mental status will improve Outcome: Progressing

## 2024-04-06 NOTE — Group Note (Signed)
 Recreation Therapy Group Note   Group Topic:Team Building  Group Date: 04/06/2024 Start Time: 1308 End Time: 1000 Facilitators: Regana Kemple-McCall, LRT,CTRS Location: [address] Dayroom   Group Topic: Communication, Team Building, Problem Solving  Goal Area(s) Addresses:  Patient will effectively work with peer towards shared goal.  Patient will identify skills used to make activity successful.  Patient will identify how skills used during activity can be applied to reach post d/c goals.   Intervention: STEM Activity- Glass blower/designer  Activity: Tallest [employer]. In teams of 5-6, patients were given 11 craft pipe cleaners. Using the materials provided, patients were instructed to compete again the opposing team(s) to build the tallest free-standing structure from floor level. The activity was timed; difficulty increased by Clinical research associate as Production designer, theatre/television/film continued.  Systematically resources were removed with additional directions for example, placing one arm behind their back, working in silence, and shape stipulations. LRT facilitated post-activity discussion reviewing team processes and necessary communication skills involved in completion. Patients were encouraged to reflect how the skills utilized, or not utilized, in this activity can be incorporated to positively impact support systems post discharge.  Education: Pharmacist, community, Scientist, physiological, Discharge Planning   Education Outcome: Acknowledges education/In group clarification offered/Needs additional education.    Affect/Mood: Appropriate   Participation Level: Engaged   Participation Quality: Independent   Behavior: Appropriate   Speech/Thought Process: Focused   Insight: Good   Judgement: Good   Modes of Intervention: Team-building   Patient Response to Interventions:  Engaged   Education Outcome:  In group clarification offered    Clinical Observations/Individualized Feedback: Pt was engaged and  appeared to have a good time working on this activity. Pt listened to suggestions of peers and assisted were help was needed.    Plan: Continue to engage patient in RT group sessions 2-3x/week.   Brandi Martinez, LRT,CTRS 04/06/2024 12:24 PM

## 2024-04-06 NOTE — Progress Notes (Signed)
 Adult Psychoeducational Group Note  Date:  04/06/2024 Time:  9:42 PM  Group Topic/Focus:  Wrap-Up Group:   The focus of this group is to help patients review their daily goal of treatment and discuss progress on daily workbooks.  Participation Level:  Active  Participation Quality:  Appropriate  Affect:  Appropriate  Cognitive:  Appropriate  Insight: Appropriate  Engagement in Group:  Engaged  Modes of Intervention:  Discussion  Additional Comments:  Focus today a good day  Caye Cocks 04/06/2024, 9:42 PM

## 2024-04-06 NOTE — [facility] Suicide Risk Assessment (Signed)
 [facility] INPATIENT:  Family/Significant Other Suicide Prevention Education  Suicide Prevention Education:  Education Completed;  Mother, Brandi Martinez [phone],  (name of family member/significant other) has been identified by the patient as the family member/significant other with whom the patient will be residing, and identified as the person(s) who will aid the patient in the event of a mental health crisis (suicidal ideations/suicide attempt).  With written consent from the patient, the family member/significant other has been provided the following suicide prevention education, prior to the and/or following the discharge of the patient.  The suicide prevention education provided includes the following: Suicide risk factors Suicide prevention and interventions National Suicide Hotline telephone number [facility] assessment telephone number [facility] Emergency Assistance 911 [facility] and/or Residential Mobile Crisis Unit telephone number  Request made of family/significant other to: Remove weapons (e.g., guns, rifles, knives), all items previously/currently identified as safety concern.   Remove drugs/medications (over-the-counter, prescriptions, illicit drugs), all items previously/currently identified as a safety concern.  The family member/significant other verbalizes understanding of the suicide prevention education information provided.  The family member/significant other agrees to remove the items of safety concern listed above.  Brandi Martinez 04/06/2024, 11:49 AM

## 2024-04-06 NOTE — Progress Notes (Signed)
   04/06/24 0745  Psych Admission Type (Psych Patients Only)  Admission Status Voluntary  Psychosocial Assessment  Patient Complaints None  Eye Contact Fair  Facial Expression Animated  Affect Anxious  Speech Logical/coherent  Interaction Assertive  Motor Activity Fidgety  Appearance/Hygiene Unremarkable  Behavior Characteristics Cooperative;Calm  Mood Pleasant  Thought Process  Coherency WDL  Content WDL  Delusions None reported or observed  Perception WDL  Hallucination None reported or observed  Judgment Impaired  Confusion None  Danger to Self  Current suicidal ideation? Denies  Agreement Not to Harm Self Yes  Description of Agreement Verbal  Danger to Others  Danger to Others None reported or observed

## 2024-04-06 NOTE — Plan of Care (Signed)
  Problem: Education: Goal: Emotional status will improve Outcome: Progressing Goal: Mental status will improve Outcome: Progressing Goal: Verbalization of understanding the information provided will improve Outcome: Progressing   Problem: Activity: Goal: Interest or engagement in activities will improve Outcome: Progressing   Problem: Safety: Goal: Periods of time without injury will increase Outcome: Progressing

## 2024-04-06 NOTE — Group Note (Signed)
 Date:  04/06/2024 Time:  11:57 AM  Group Topic/Focus:  Goals Group:   The focus of this group is to help patients establish daily goals to achieve during treatment and discuss how the patient can incorporate goal setting into their daily lives to aide in recovery. Orientation:   The focus of this group is to educate the patient on the purpose and policies of crisis stabilization and provide a format to answer questions about their admission.  The group details unit policies and expectations of patients while admitted.    Participation Level:  Minimal  Participation Quality:  Appropriate  Affect:  Appropriate  Cognitive:  Appropriate  Insight: Appropriate  Engagement in Group:  Developing/Improving  Modes of Intervention:  Discussion and Orientation  Additional Comments:    Zennie Ayars D Devian Bartolomei 04/06/2024, 11:57 AM

## 2024-04-07 DIAGNOSIS — F314 Bipolar disorder, current episode depressed, severe, without psychotic features: Secondary | ICD-10-CM

## 2024-04-07 MED ORDER — TRAZODONE HCL 50 MG PO TABS: 50.0000 mg | ORAL_TABLET | Freq: Every evening | ORAL | 0 refills | Status: AC | PRN

## 2024-04-07 MED ORDER — HYDROXYZINE HCL 25 MG PO TABS: 25.0000 mg | ORAL_TABLET | Freq: Three times a day (TID) | ORAL | 0 refills | Status: AC | PRN

## 2024-04-07 MED ORDER — NICOTINE POLACRILEX 2 MG MT GUM
2.0000 mg | CHEWING_GUM | OROMUCOSAL | 0 refills | Status: AC | PRN
Start: 2024-04-07 — End: ?

## 2024-04-07 MED ORDER — ARIPIPRAZOLE 5 MG PO TABS
5.0000 mg | ORAL_TABLET | Freq: Every day | ORAL | 0 refills | Status: AC
Start: 2024-04-08 — End: ?
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
## 2024-04-07 NOTE — Group Note (Signed)
 LCSW Group Therapy Note   Group Date: 04/07/2024 Start Time: 1100 End Time: 1200   Participation:  patient was present  Type of Therapy:  Group Therapy  Title: Healing Flames: Navigating Anger with Compassion  Objective:  Foster self-awareness and promote compassion toward oneself and others when dealing with anger.  Goals: Help participants understand the underlying emotions and needs fueling anger. Provide coping strategies for healthier emotional expression and anger management.  Summary: This session explored anger as a volcano - an explosion driven by deeper feelings and unmet needs. Participants learned to identify anger triggers and underlying emotions, then practiced coping strategies like deep breathing, physical activity, and journaling. The group discussed healthy ways to manage anger before it escalates, using both personal reflection and shared experiences.  Therapeutic Modalities: Cognitive Behavioral Therapy (CBT): Challenging thoughts that fuel anger. Mindfulness: Increasing awareness of emotions and sensations.   Tupac Jeffus O Obryan Radu, LCSWA 04/07/2024  1:08 PM

## 2024-04-07 NOTE — Progress Notes (Signed)
  [facility] Adult Case Management Discharge Plan :  Will you be returning to the same living situation after discharge:  Yes,  pt is returning home at discharge At discharge, do you have transportation home?: Yes,  pt will be picked up by mother between 1200-1300 Do you have the ability to pay for your medications: Yes,  pt has active health insurance coverage  Release of information consent forms completed and in the chart;  Patient's signature needed at discharge.  Patient to Follow up at:  Follow-up Information     [facility] Follow up on 04/15/2024.   Why: You have an appointment for medication management services on  04/15/24 at 3:00 pm.   The appointment will be Virtual, but you may call to switch to in person. Contact information: [address] [place] [address] [phone]         [facility] Follow up on 06/14/2024.   Specialty: Behavioral Health Why: You have an appointment for therapy services on 06/14/24 at 10:00 am.   The appointment will be held in peson  .  * Please call to confirm or cancel this appointment at least 48 hours prior. Contact information: [address] [place]  [id number] [phone]                Next level of care provider has access to [facility] Link:no  Safety Planning and Suicide Prevention discussed: Yes,  Mother, Brandi Martinez [phone]     Has patient been referred to the Quitline?: Patient refused referral for treatment  Patient has been referred for addiction treatment: No known substance use disorder.  Brandi Martinez, LCSWA 04/07/2024, 9:22 AM

## 2024-04-07 NOTE — BH Assessment (Signed)
 Patient alert and oriented. Pt looking forward to going home today. Sleep meds given. Attended group last evening. 15 min checks maintained. Denies SI/HI, and states anxiety and depression are low
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
## 2024-04-07 NOTE — [facility] Group Notes (Signed)
 The focus of this group is to identify appropriate goals for recovery and establish a plan to achieve them.   Pt attended group    Scale 1-10 0    Goal: Going home

## 2024-04-07 NOTE — Progress Notes (Signed)
 D: Pt A&O x 4. Denies SI, HI, AVH, and pain at this time. D/c home as ordered. Picked up in lobby by mom.   A: D/c instructions reviewed with pt including electronic prescriptions and follow up appointments; compliance encouraged. All belongings from locker 19 given to pt at time of departure. Scheduled medications given with verbal education and effects monitored. Safety checks maintained without incident until time of d/c.   R: Pt receptive to care. Compliant with meds when offered. Denies adverse drug reactions when assessed. Verbalized understanding related to d/c instructions. Signed belongings sheet in agreement with items received from locker. Ambulatory with steady gait. Appears to be in no physical distress at time of departure.

## 2024-04-07 NOTE — [facility] Group Notes (Signed)
 Spirituality Group   Focus of discussion: Gratitude and Strength Awareness   Process: Following theoretical framework of group therapy of Irvin Yalom and further informed by Rogerian and Relational Cultural Theory approaches, participants invited to name:   *Sources of gratitude (internal>external)  *Articulate gratitude for self  *Name a personal strength/gift/skill  *Locate points of resonance among group members/engage the "here and now"   Conclude with grounding/breathwork     Observations: Brandi Martinez was an active participant in the group discussion.  Ivan Lacher L. Brandi Martinez Brandi Martinez, M.Div  [phone]

## 2024-06-14 ENCOUNTER — Ambulatory Visit (HOSPITAL_COMMUNITY): Payer: MEDICAID | Admitting: Clinical
